# Patient Record
Sex: Female | Born: 1985 | Race: Black or African American | Hispanic: No | Marital: Married | Smoking: Never smoker
Health system: Southern US, Community
[De-identification: ages and names within clinical notes are randomized; demographics above are authoritative.]

## PROBLEM LIST (undated history)

## (undated) DIAGNOSIS — F32A Depression, unspecified: Secondary | ICD-10-CM

---

## 2017-11-27 ENCOUNTER — Other Ambulatory Visit: Payer: Self-pay

## 2017-11-27 ENCOUNTER — Encounter (HOSPITAL_BASED_OUTPATIENT_CLINIC_OR_DEPARTMENT_OTHER): Payer: Self-pay | Admitting: Emergency Medicine

## 2017-11-27 ENCOUNTER — Emergency Department (HOSPITAL_BASED_OUTPATIENT_CLINIC_OR_DEPARTMENT_OTHER)
Admission: EM | Admit: 2017-11-27 | Discharge: 2017-11-27 | Disposition: A | Discharge status: Home / Self Care | Payer: 59 | Attending: Physician Assistant | Admitting: Physician Assistant

## 2017-11-27 DIAGNOSIS — J029 Acute pharyngitis, unspecified: Secondary | ICD-10-CM | POA: Diagnosis present

## 2017-11-27 DIAGNOSIS — J020 Streptococcal pharyngitis: Secondary | ICD-10-CM

## 2017-11-27 LAB — RAPID STREP SCREEN (MED CTR MEBANE ONLY): Streptococcus, Group A Screen (Direct): POSITIVE — AB

## 2017-11-27 MED ORDER — IBUPROFEN 800 MG PO TABS
800.0000 mg | ORAL_TABLET | Freq: Once | ORAL | Status: AC
  Administered 2017-11-27: 800 mg via ORAL
  Filled 2017-11-27: qty 1

## 2017-11-27 MED ORDER — PENICILLIN G BENZATHINE 1200000 UNIT/2ML IM SUSP
1.2000 10*6.[IU] | Freq: Once | INTRAMUSCULAR | Status: AC
  Administered 2017-11-27: 1.2 10*6.[IU] via INTRAMUSCULAR
  Filled 2017-11-27: qty 2

## 2017-11-27 NOTE — ED Triage Notes (Addendum)
Has felt bad since Wed with sore throat, fever, h/a. Yesterday started having diarrhea , no abd pain and woke up with white spots on tonsils . Co-worker was been out this week with Flu

## 2017-11-27 NOTE — Discharge Instructions (Addendum)
Use ibuprofen every 6 hours to help with inflammation and the pain and swelling.  Please throw your toothbrush as we discussed.  Please be sure to stay hydrated using Gatorade or Gatorade plus water.

## 2017-11-27 NOTE — ED Provider Notes (Signed)
[facility] EMERGENCY DEPARTMENT Provider Note   CSN: [id number] Arrival date & time: 11/27/17  [id number]     History   Chief Complaint Chief Complaint  Patient presents with  . Sore Throat    diarrhea    HPI [place] Durwin GlazeGlover is a 32 y.o. female.  HPI   32 year old female presenting with sore throat.  Patient also had a touch of diarrhea.  Patient's kids have been recently sick.  Patient woke up this morning noticed some white spots on the back of her throat.  Came here to be evaluated.  Patient eating and drinking normally.  Does have sore throat.  History reviewed. No pertinent past medical history.  There are no active problems to display for this patient.   History reviewed. No pertinent surgical history.   OB History   None      Home Medications    Prior to Admission medications   Not on File    Family History No family history on file.  Social History Social History   Tobacco Use  . Smoking status: Never Smoker  . Smokeless tobacco: Never Used  Substance Use Topics  . Alcohol use: Yes    Frequency: Never    Comment: social  . Drug use: Never     Allergies   Patient has no known allergies.   Review of Systems Review of Systems  Constitutional: Positive for fatigue.  HENT: Positive for sore throat.   Gastrointestinal: Positive for diarrhea.  All other systems reviewed and are negative.    Physical Exam Updated Vital Signs BP 122/82 (BP Location: Left Arm)   Pulse 76   Temp 98.7 F (37.1 C) (Oral)   Resp 16   Ht 5\' 5"  (1.651 m)   Wt 74.4 kg (164 lb)   SpO2 100%   BMI 27.29 kg/m   Physical Exam  Constitutional: She is oriented to person, place, and time. She appears well-developed and well-nourished.  HENT:  Head: Normocephalic and atraumatic.  Mouth/Throat: No uvula swelling. Oropharyngeal exudate and posterior oropharyngeal erythema present.  Eyes: Pupils are equal, round, and reactive to light. EOM are normal. Right eye  exhibits no discharge.  Cardiovascular: Normal rate, regular rhythm and normal heart sounds.  No murmur heard. Pulmonary/Chest: Effort normal and breath sounds normal. She has no wheezes. She has no rales.  Neurological: She is oriented to person, place, and time.  Skin: Skin is warm and dry. She is not diaphoretic.  Psychiatric: She has a normal mood and affect.  Nursing note and vitals reviewed.    ED Treatments / Results  Labs (all labs ordered are listed, but only abnormal results are displayed) Labs Reviewed  RAPID STREP SCREEN ([facility] ONLY) - Abnormal; Notable for the following components:      Result Value   Streptococcus, Group A Screen (Direct) POSITIVE (*)    All other components within normal limits    EKG None  Radiology No results found.  Procedures Procedures (including critical care time)  Medications Ordered in ED Medications  penicillin g benzathine (BICILLIN LA) 1200000 UNIT/2ML injection 1.2 Million Units (has no administration in time range)  ibuprofen (ADVIL,MOTRIN) tablet 800 mg (has no administration in time range)     Initial Impression / Assessment and Plan / ED Course  I have reviewed the triage vital signs and the nursing notes.  Pertinent labs & imaging results that were available during my care of the patient were reviewed by me and considered in  my medical decision making (see chart for details).    32 year old female presenting with sore throat.  Patient also had a touch of diarrhea.  Patient's kids have been recently sick.  Patient woke up this morning noticed some white spots on the back of her throat.  Came here to be evaluated.  Patient eating and drinking normally.  Does have sore throat.   9:08 AM Strep positive.  Will treat.  Final Clinical Impressions(s) / ED Diagnoses   Final diagnoses:  Strep throat    ED Discharge Orders    None       Abelino Derrick, MD 11/27/17 [phone]

## 2018-02-24 DIAGNOSIS — H00021 Hordeolum internum right upper eyelid: Secondary | ICD-10-CM | POA: Diagnosis not present

## 2018-02-24 DIAGNOSIS — H05011 Cellulitis of right orbit: Secondary | ICD-10-CM | POA: Diagnosis not present

## 2018-03-04 DIAGNOSIS — Z13220 Encounter for screening for lipoid disorders: Secondary | ICD-10-CM | POA: Diagnosis not present

## 2018-03-04 DIAGNOSIS — Z0000 Encounter for general adult medical examination without abnormal findings: Secondary | ICD-10-CM | POA: Diagnosis not present

## 2018-03-04 DIAGNOSIS — Z1339 Encounter for screening examination for other mental health and behavioral disorders: Secondary | ICD-10-CM | POA: Diagnosis not present

## 2018-03-04 DIAGNOSIS — Z131 Encounter for screening for diabetes mellitus: Secondary | ICD-10-CM | POA: Diagnosis not present

## 2018-03-04 DIAGNOSIS — R69 Illness, unspecified: Secondary | ICD-10-CM | POA: Diagnosis not present

## 2018-03-04 DIAGNOSIS — Z114 Encounter for screening for human immunodeficiency virus [HIV]: Secondary | ICD-10-CM | POA: Diagnosis not present

## 2018-03-04 DIAGNOSIS — M25551 Pain in right hip: Secondary | ICD-10-CM | POA: Diagnosis not present

## 2018-07-20 DIAGNOSIS — J069 Acute upper respiratory infection, unspecified: Secondary | ICD-10-CM | POA: Diagnosis not present

## 2018-07-20 DIAGNOSIS — R05 Cough: Secondary | ICD-10-CM | POA: Diagnosis not present

## 2019-07-18 DIAGNOSIS — R05 Cough: Secondary | ICD-10-CM | POA: Diagnosis not present

## 2020-02-17 ENCOUNTER — Other Ambulatory Visit: Payer: Self-pay

## 2020-02-17 ENCOUNTER — Encounter (HOSPITAL_BASED_OUTPATIENT_CLINIC_OR_DEPARTMENT_OTHER): Payer: Self-pay | Admitting: Emergency Medicine

## 2020-02-17 ENCOUNTER — Emergency Department (HOSPITAL_BASED_OUTPATIENT_CLINIC_OR_DEPARTMENT_OTHER): Payer: 59

## 2020-02-17 DIAGNOSIS — U071 COVID-19: Secondary | ICD-10-CM | POA: Diagnosis not present

## 2020-02-17 DIAGNOSIS — R05 Cough: Secondary | ICD-10-CM | POA: Diagnosis not present

## 2020-02-17 DIAGNOSIS — Z5321 Procedure and treatment not carried out due to patient leaving prior to being seen by health care provider: Secondary | ICD-10-CM | POA: Diagnosis not present

## 2020-02-17 DIAGNOSIS — R0602 Shortness of breath: Secondary | ICD-10-CM | POA: Diagnosis not present

## 2020-02-17 DIAGNOSIS — R69 Illness, unspecified: Secondary | ICD-10-CM | POA: Diagnosis present

## 2020-02-17 NOTE — ED Triage Notes (Signed)
Patient states that she has had been sick x 2 weeks. Pt states that she tested positive for COVID 2 days ago. She states that she has a cough but reports that she feels SOB and pressure when she breaths. Pt has a congested cough

## 2020-02-18 ENCOUNTER — Emergency Department (HOSPITAL_BASED_OUTPATIENT_CLINIC_OR_DEPARTMENT_OTHER)
Admission: EM | Admit: 2020-02-18 | Discharge: 2020-02-18 | Disposition: A | Discharge status: Home / Self Care | Payer: 59 | Attending: Emergency Medicine | Admitting: Emergency Medicine

## 2020-02-19 ENCOUNTER — Telehealth (HOSPITAL_COMMUNITY): Payer: Self-pay

## 2020-02-19 DIAGNOSIS — U071 COVID-19: Secondary | ICD-10-CM | POA: Diagnosis not present

## 2020-02-19 DIAGNOSIS — Z9189 Other specified personal risk factors, not elsewhere classified: Secondary | ICD-10-CM | POA: Diagnosis not present

## 2020-12-07 DIAGNOSIS — R69 Illness, unspecified: Secondary | ICD-10-CM | POA: Diagnosis not present

## 2021-08-15 IMAGING — DX DG CHEST 1V PORT
1 series · 1 of 1 positions shown · non-contrast
Comparison: None.

CLINICAL DATA: Cough, shortness of breath, XLLOC-GS positive

EXAM:
PORTABLE CHEST 1 VIEW

[chest ap]
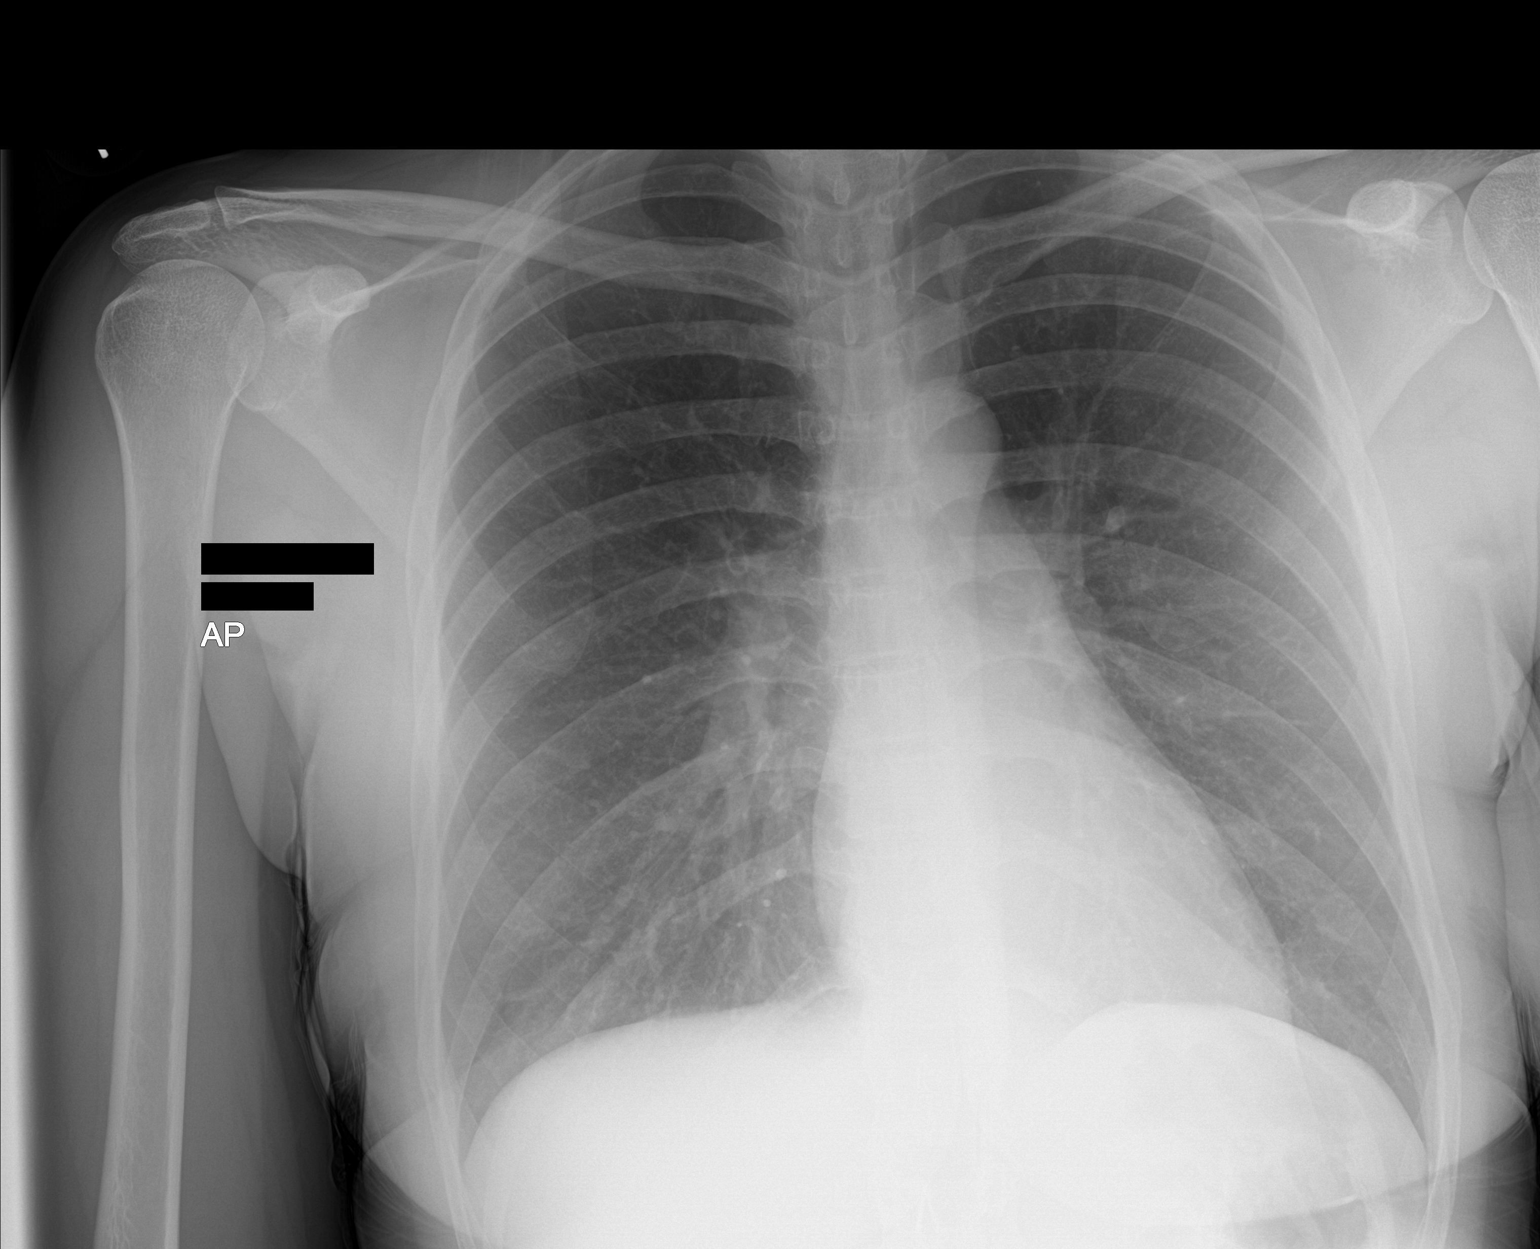

[1 of 1 positions shown; findings below may reference images not displayed]

FINDINGS: No consolidation, features of edema, pneumothorax, or effusion.
Pulmonary vascularity is normally distributed. The cardiomediastinal
contours are unremarkable. No acute osseous or soft tissue
abnormality.
IMPRESSION: No acute cardiopulmonary abnormality.

## 2022-03-11 DIAGNOSIS — R059 Cough, unspecified: Secondary | ICD-10-CM | POA: Diagnosis not present

## 2023-02-10 DIAGNOSIS — J029 Acute pharyngitis, unspecified: Secondary | ICD-10-CM | POA: Diagnosis not present

## 2023-02-10 DIAGNOSIS — R059 Cough, unspecified: Secondary | ICD-10-CM | POA: Diagnosis not present

## 2023-02-10 DIAGNOSIS — R590 Localized enlarged lymph nodes: Secondary | ICD-10-CM | POA: Diagnosis not present

## 2023-12-30 ENCOUNTER — Emergency Department (HOSPITAL_BASED_OUTPATIENT_CLINIC_OR_DEPARTMENT_OTHER)
Admission: EM | Admit: 2023-12-30 | Discharge: 2023-12-31 | Disposition: A | Discharge status: Home / Self Care | Attending: Emergency Medicine | Admitting: Emergency Medicine

## 2023-12-30 ENCOUNTER — Other Ambulatory Visit: Payer: Self-pay

## 2023-12-30 ENCOUNTER — Encounter (HOSPITAL_BASED_OUTPATIENT_CLINIC_OR_DEPARTMENT_OTHER): Payer: Self-pay

## 2023-12-30 DIAGNOSIS — R1084 Generalized abdominal pain: Secondary | ICD-10-CM | POA: Insufficient documentation

## 2023-12-30 DIAGNOSIS — R7989 Other specified abnormal findings of blood chemistry: Secondary | ICD-10-CM | POA: Diagnosis present

## 2023-12-30 DIAGNOSIS — K824 Cholesterolosis of gallbladder: Secondary | ICD-10-CM | POA: Diagnosis not present

## 2023-12-30 DIAGNOSIS — R197 Diarrhea, unspecified: Secondary | ICD-10-CM | POA: Diagnosis not present

## 2023-12-30 DIAGNOSIS — R112 Nausea with vomiting, unspecified: Secondary | ICD-10-CM | POA: Diagnosis present

## 2023-12-30 HISTORY — DX: Depression, unspecified: F32.A

## 2023-12-30 LAB — CBC WITH DIFFERENTIAL/PLATELET
Abs Immature Granulocytes: 0.02 10*3/uL (ref 0.00–0.07)
Basophils Absolute: 0 10*3/uL (ref 0.0–0.1)
Basophils Relative: 0 %
Eosinophils Absolute: 0 10*3/uL (ref 0.0–0.5)
Eosinophils Relative: 0 %
HCT: 43.6 % (ref 36.0–46.0)
Hemoglobin: 15.9 g/dL — ABNORMAL HIGH (ref 12.0–15.0)
Immature Granulocytes: 0 %
Lymphocytes Relative: 10 %
Lymphs Abs: 0.8 10*3/uL (ref 0.7–4.0)
MCH: 33.8 pg (ref 26.0–34.0)
MCHC: 36.5 g/dL — ABNORMAL HIGH (ref 30.0–36.0)
MCV: 92.6 fL (ref 80.0–100.0)
Monocytes Absolute: 0.5 10*3/uL (ref 0.1–1.0)
Monocytes Relative: 7 %
Neutro Abs: 6.4 10*3/uL (ref 1.7–7.7)
Neutrophils Relative %: 83 %
Platelets: 308 10*3/uL (ref 150–400)
RBC: 4.71 MIL/uL (ref 3.87–5.11)
RDW: 14.3 % (ref 11.5–15.5)
WBC: 7.8 10*3/uL (ref 4.0–10.5)
nRBC: 0 % (ref 0.0–0.2)

## 2023-12-30 LAB — URINALYSIS, ROUTINE W REFLEX MICROSCOPIC
Glucose, UA: NEGATIVE mg/dL
Ketones, ur: 40 mg/dL — AB
Leukocytes,Ua: NEGATIVE
Nitrite: NEGATIVE
Protein, ur: 100 mg/dL — AB
Specific Gravity, Urine: >=1.03 (ref 1.005–1.030)
pH: 5.5 (ref 5.0–8.0)

## 2023-12-30 LAB — URINALYSIS, MICROSCOPIC (REFLEX)

## 2023-12-30 LAB — BASIC METABOLIC PANEL WITH GFR
Anion gap: 23 — ABNORMAL HIGH (ref 5–15)
BUN: 9 mg/dL (ref 6–20)
CO2: 20 mmol/L — ABNORMAL LOW (ref 22–32)
Calcium: 9.9 mg/dL (ref 8.9–10.3)
Chloride: 92 mmol/L — ABNORMAL LOW (ref 98–111)
Creatinine, Ser: 0.95 mg/dL (ref 0.44–1.00)
GFR, Estimated: >60 mL/min (ref >60)
Glucose, Bld: 93 mg/dL (ref 70–99)
Potassium: 4 mmol/L (ref 3.5–5.1)
Sodium: 136 mmol/L (ref 135–145)

## 2023-12-30 LAB — PREGNANCY, URINE: Preg Test, Ur: NEGATIVE

## 2023-12-30 MED ORDER — SODIUM CHLORIDE 0.9 % IV BOLUS
1000.0000 mL | Freq: Once | INTRAVENOUS | Status: AC
  Administered 2023-12-30: 1000 mL via INTRAVENOUS

## 2023-12-30 MED ORDER — LACTATED RINGERS IV BOLUS: 1000.0000 mL | Freq: Once | INTRAVENOUS | Status: DC

## 2023-12-30 MED ORDER — ONDANSETRON HCL 4 MG/2ML IJ SOLN
4.0000 mg | Freq: Once | INTRAMUSCULAR | Status: AC
  Administered 2023-12-31: 4 mg via INTRAVENOUS
  Filled 2023-12-30: qty 2

## 2023-12-30 MED ORDER — FAMOTIDINE IN NACL 20-0.9 MG/50ML-% IV SOLN
20.0000 mg | Freq: Once | INTRAVENOUS | Status: AC
  Administered 2023-12-31: 20 mg via INTRAVENOUS
  Filled 2023-12-30: qty 50

## 2023-12-30 MED ORDER — PANTOPRAZOLE SODIUM 40 MG IV SOLR
40.0000 mg | Freq: Once | INTRAVENOUS | Status: AC
  Administered 2023-12-31: 40 mg via INTRAVENOUS
  Filled 2023-12-30: qty 10

## 2023-12-30 NOTE — ED Triage Notes (Signed)
 Started on Wellbutrin, 1st dose today 1/2 pill (150mg ) @ 1100am Had 2 shots of tequila at noon and has been vomiting since then.

## 2023-12-30 NOTE — ED Provider Notes (Signed)
 [place] EMERGENCY DEPARTMENT AT [facility] Provider Note   CSN: [id number] Arrival date & time: 12/30/23  2157     Patient presents with: Emesis   Shari Anderson is a 38 y.o. female.   Patient presents with nausea and morning since approximately noon.  She felt well prior to this.  She started new medication today Wellbutrin for the first time.  She took this around 11 AM followed by 2 shots of alcohol  about an hour later.  She then developed nausea and vomiting.  Started at the yellow emesis with some blood streaks.  States has been vomiting every hour since.  Now having some diarrhea as well.  Diffuse crampy abdominal pain.  No history of acid reflux or ulcers.  No previous abdominal surgeries.  No chest pain or shortness of breath.  No pain with urination or blood in the urine.  No vaginal bleeding or discharge.  She has never had this medication before.  She also uses marijuana on a regular basis and felt like it made her worse this afternoon.  No travel or sick contacts  The history is provided by the patient and the spouse.  Emesis Associated symptoms: abdominal pain and diarrhea   Associated symptoms: no arthralgias, no cough, no fever, no headaches and no myalgias        Prior to Admission medications   Not on File    Allergies: Patient has no known allergies.    Review of Systems  Constitutional:  Positive for activity change and appetite change. Negative for fever.  HENT:  Negative for congestion and rhinorrhea.   Respiratory:  Negative for cough and shortness of breath.   Cardiovascular:  Negative for chest pain.  Gastrointestinal:  Positive for abdominal pain, diarrhea, nausea and vomiting.  Genitourinary:  Negative for dysuria and hematuria.  Musculoskeletal:  Negative for arthralgias and myalgias.  Skin:  Negative for rash.  Neurological:  Negative for dizziness, weakness and headaches.   all other systems are negative except as noted in the HPI and  PMH.    Updated Vital Signs BP (!) 146/96   Pulse (!) 106   Temp 99.8 F (37.7 C) (Tympanic)   Ht 5' 5 (1.651 m)   Wt 74.8 kg   SpO2 99%   BMI 27.46 kg/m   Physical Exam Vitals and nursing note reviewed.  Constitutional:      General: She is not in acute distress.    Appearance: She is well-developed.  HENT:     Head: Normocephalic and atraumatic.     Mouth/Throat:     Pharynx: No oropharyngeal exudate.  Eyes:     Conjunctiva/sclera: Conjunctivae normal.     Pupils: Pupils are equal, round, and reactive to light.  Neck:     Comments: No meningismus. Cardiovascular:     Rate and Rhythm: Normal rate and regular rhythm.     Heart sounds: Normal heart sounds. No murmur heard. Pulmonary:     Effort: Pulmonary effort is normal. No respiratory distress.     Breath sounds: Normal breath sounds.  Abdominal:     Palpations: Abdomen is soft.     Tenderness: There is no abdominal tenderness. There is no guarding or rebound.     Comments: Soft abdomen, no guarding or rebound  Musculoskeletal:        General: No tenderness. Normal range of motion.     Cervical back: Normal range of motion and neck supple.     Comments: No CVA  tenderness  Skin:    General: Skin is warm.  Neurological:     Mental Status: She is alert and oriented to person, place, and time.     Cranial Nerves: No cranial nerve deficit.     Motor: No abnormal muscle tone.     Coordination: Coordination normal.     Comments:  5/5 strength throughout. CN 2-12 intact.Equal grip strength.   Psychiatric:        Behavior: Behavior normal.     (all labs ordered are listed, but only abnormal results are displayed) Labs Reviewed  URINALYSIS, ROUTINE W REFLEX MICROSCOPIC - Abnormal; Notable for the following components:      Result Value   Hgb urine dipstick TRACE (*)    Bilirubin Urine SMALL (*)    Ketones, ur 40 (*)    Protein, ur 100 (*)    All other components within normal limits  CBC WITH  DIFFERENTIAL/PLATELET - Abnormal; Notable for the following components:   Hemoglobin 15.9 (*)    MCHC 36.5 (*)    All other components within normal limits  BASIC METABOLIC PANEL WITH GFR - Abnormal; Notable for the following components:   Chloride 92 (*)    CO2 20 (*)    Anion gap 23 (*)    All other components within normal limits  URINALYSIS, MICROSCOPIC (REFLEX) - Abnormal; Notable for the following components:   Bacteria, UA FEW (*)    All other components within normal limits  HEPATIC FUNCTION PANEL - Abnormal; Notable for the following components:   AST 140 (*)    ALT 111 (*)    Bilirubin, Direct 0.4 (*)    All other components within normal limits  BASIC METABOLIC PANEL WITH GFR - Abnormal; Notable for the following components:   Sodium 134 (*)    Chloride 95 (*)    CO2 21 (*)    Anion gap 18 (*)    All other components within normal limits  PREGNANCY, URINE  LIPASE, BLOOD  LACTIC ACID, PLASMA  LACTIC ACID, PLASMA    EKG: None  Radiology: CT ABDOMEN PELVIS W CONTRAST Result Date: 12/31/2023 CLINICAL DATA:  Bowel obstruction suspected Had 2 shots of tequila at noon and has been vomiting since then. EXAM: CT ABDOMEN AND PELVIS WITH CONTRAST TECHNIQUE: Multidetector CT imaging of the abdomen and pelvis was performed using the standard protocol following bolus administration of intravenous contrast. RADIATION DOSE REDUCTION: This exam was performed according to the departmental dose-optimization program which includes automated exposure control, adjustment of the mA and/or kV according to patient size and/or use of iterative reconstruction technique. CONTRAST:  OMNIPAQUE  IOHEXOL  300 MG/ML  SOLN COMPARISON:  None Available. FINDINGS: Lower chest: No acute abnormality. Hepatobiliary: No focal liver abnormality. No gallstones, gallbladder wall thickening, or pericholecystic fluid. No biliary dilatation. Pancreas: No focal lesion. Normal pancreatic contour. No surrounding  inflammatory changes. No main pancreatic ductal dilatation. Spleen: Normal in size without focal abnormality. Adrenals/Urinary Tract: No adrenal nodule bilaterally. Bilateral kidneys enhance symmetrically. No hydronephrosis. No hydroureter. The urinary bladder is unremarkable. Stomach/Bowel: Stomach is within normal limits. No evidence of bowel wall thickening or dilatation. Appendix appears normal. Vascular/Lymphatic: No abdominal aorta or iliac aneurysm. No abdominal, pelvic, or inguinal lymphadenopathy. Reproductive: T-shaped intrauterine device in appropriate position within the uterus. Uterus and bilateral adnexa are unremarkable. Other: No intraperitoneal free fluid. No intraperitoneal free gas. No organized fluid collection. Musculoskeletal: No abdominal wall hernia or abnormality. No suspicious lytic or blastic osseous lesions. No acute  displaced fracture. IMPRESSION: No acute intra-abdominal or intrapelvic abnormality. Electronically Signed   By: Morgane  Naveau M.D.   On: 12/31/2023 00:39     Procedures   Medications Ordered in the ED  sodium chloride  0.9 % bolus 1,000 mL (has no administration in time range)  ondansetron  (ZOFRAN ) injection 4 mg (has no administration in time range)  famotidine  (PEPCID ) IVPB 20 mg premix (has no administration in time range)  pantoprazole  (PROTONIX ) injection 40 mg (has no administration in time range)                                    Medical Decision Making Amount and/or Complexity of Data Reviewed Labs: ordered. Decision-making details documented in ED Course. Radiology: ordered and independent interpretation performed. Decision-making details documented in ED Course. ECG/medicine tests: ordered and independent interpretation performed. Decision-making details documented in ED Course.  Risk Prescription drug management.   Nausea, vomiting, diarrhea since noon today after starting Wellbutrin as well as using some alcohol .  Stable vital signs.   Mildly tachycardic.  Abdomen soft without peritoneal signs.  Suspect symptoms likely secondary to medication and alcohol  use.  Will give IV fluids and antiemetics.  Low concern for cholecystitis or appendicitis.  HCG is negative.  Labs with Anion gap acidosis and ketones.  Will hydrate.  No fever.  IVF Given.  LFT elevation noted of unclear etiology.  Did have some alcohol  tonight.  Denies any abdominal pain.  Lipase is normal. LFT likely secondary to use.  Lipase is normal.  Anion gap has improved with IV fluids. CT scan is negative for acute pathology.  Patient feels improved and able to tolerate p.o.  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  Will start PPI.  Avoid caffeine, NSAID medications, alcohol , spicy foods.  Continue Wellbutrin as prescribed. Return to the ED with worsening symptoms.     Final diagnoses:  Nausea and vomiting, unspecified vomiting type    ED Discharge Orders     None          Kristl Morioka, Garnette, MD 12/31/23 [phone]

## 2023-12-31 ENCOUNTER — Ambulatory Visit (HOSPITAL_BASED_OUTPATIENT_CLINIC_OR_DEPARTMENT_OTHER)
Admission: RE | Admit: 2023-12-31 | Discharge: 2023-12-31 | Disposition: A | Discharge status: Home / Self Care | Source: Ambulatory Visit | Attending: Emergency Medicine | Admitting: Emergency Medicine

## 2023-12-31 ENCOUNTER — Other Ambulatory Visit (HOSPITAL_BASED_OUTPATIENT_CLINIC_OR_DEPARTMENT_OTHER)

## 2023-12-31 ENCOUNTER — Emergency Department (HOSPITAL_BASED_OUTPATIENT_CLINIC_OR_DEPARTMENT_OTHER)

## 2023-12-31 ENCOUNTER — Encounter (HOSPITAL_BASED_OUTPATIENT_CLINIC_OR_DEPARTMENT_OTHER): Payer: Self-pay

## 2023-12-31 DIAGNOSIS — K824 Cholesterolosis of gallbladder: Secondary | ICD-10-CM | POA: Insufficient documentation

## 2023-12-31 LAB — HEPATIC FUNCTION PANEL
ALT: 111 U/L — ABNORMAL HIGH (ref 0–44)
AST: 140 U/L — ABNORMAL HIGH (ref 15–41)
Albumin: 4.8 g/dL (ref 3.5–5.0)
Alkaline Phosphatase: 75 U/L (ref 38–126)
Bilirubin, Direct: 0.4 mg/dL — ABNORMAL HIGH (ref 0.0–0.2)
Indirect Bilirubin: 0.4 mg/dL (ref 0.3–0.9)
Total Bilirubin: 0.7 mg/dL (ref 0.0–1.2)
Total Protein: 7.9 g/dL (ref 6.5–8.1)

## 2023-12-31 LAB — BASIC METABOLIC PANEL WITH GFR
Anion gap: 18 — ABNORMAL HIGH (ref 5–15)
BUN: 9 mg/dL (ref 6–20)
CO2: 21 mmol/L — ABNORMAL LOW (ref 22–32)
Calcium: 9 mg/dL (ref 8.9–10.3)
Chloride: 95 mmol/L — ABNORMAL LOW (ref 98–111)
Creatinine, Ser: 0.85 mg/dL (ref 0.44–1.00)
GFR, Estimated: >60 mL/min (ref >60)
Glucose, Bld: 78 mg/dL (ref 70–99)
Potassium: 4.3 mmol/L (ref 3.5–5.1)
Sodium: 134 mmol/L — ABNORMAL LOW (ref 135–145)

## 2023-12-31 LAB — LACTIC ACID, PLASMA
Lactic Acid, Venous: 1.7 mmol/L (ref 0.5–1.9)
Lactic Acid, Venous: 1.9 mmol/L (ref 0.5–1.9)

## 2023-12-31 LAB — LIPASE, BLOOD: Lipase: 16 U/L (ref 11–51)

## 2023-12-31 MED ORDER — IOHEXOL 300 MG/ML  SOLN
100.0000 mL | Freq: Once | INTRAMUSCULAR | Status: AC | PRN
  Administered 2023-12-31: 100 mL via INTRAVENOUS

## 2023-12-31 MED ORDER — ONDANSETRON 4 MG PO TBDP: 4.0000 mg | ORAL_TABLET | Freq: Three times a day (TID) | ORAL | 0 refills | Status: AC | PRN

## 2023-12-31 NOTE — ED Provider Notes (Signed)
 Blood pressure 119/74, pulse 78, temperature 99.8 F (37.7 C), temperature source Tympanic, resp. rate 17, height 5' 5 (1.651 m), weight 74.8 kg, SpO2 99%.   In short, Shari Anderson is a 38 y.o. female with a chief complaint of Emesis .  Refer to the original H&P for additional details.  11:18 AM  Patient returning for right upper quadrant ultrasound.  She was seen last night by the nighttime provider.  Minimally elevated LFTs.  Ultrasound shows no acute cholecystitis or cholelithiasis.  She is feeling much better this morning with resolution of abdominal pain and vomiting.  Plan to have her follow-up in the coming week with her PCP for repeat LFTs.    Darra Fonda MATSU, MD 12/31/23 1119

## 2023-12-31 NOTE — Discharge Instructions (Addendum)
 Take the nausea medication as prescribed. Reduced your alcohol  intake. Start with a clear liquid diet and advance slowly as tolerated.  Follow-up later today for an ultrasound of your gallbladder to evaluate your elevated liver function test.  Reduce your alcohol  intake.  Return to the ED with new or worsening symptoms.

## 2023-12-31 NOTE — ED Provider Notes (Addendum)
 Blood pressure 119/74, pulse 78, temperature 99.8 F (37.7 C), temperature source Tympanic, resp. rate 17, height 5' 5 (1.651 m), weight 74.8 kg, SpO2 99%.   In short, Shari Anderson is a 38 y.o. female with a chief complaint of Emesis .  Refer to the original H&P for additional details.  11:17 AM  Patient returns for right upper quadrant ultrasound this morning after being seen last night.  She is feeling much better this morning.  No continued abdominal pain or vomiting.  She had minimally elevated LFTs last night.  Ultrasound shows no cholecystitis.  No cholelithiasis.  Gallbladder polyps noted.  I discussed these with the patient.  Will have her follow-up with her PCP in the coming week for repeat blood work including LFTs for trending.    Darra Fonda MATSU, MD 12/31/23 1118    Darra Fonda MATSU, MD 12/31/23 1118

## 2024-06-28 ENCOUNTER — Other Ambulatory Visit: Payer: Self-pay

## 2024-06-28 ENCOUNTER — Emergency Department (HOSPITAL_BASED_OUTPATIENT_CLINIC_OR_DEPARTMENT_OTHER)

## 2024-06-28 ENCOUNTER — Emergency Department (HOSPITAL_BASED_OUTPATIENT_CLINIC_OR_DEPARTMENT_OTHER)
Admission: EM | Admit: 2024-06-28 | Discharge: 2024-06-28 | Disposition: A | Discharge status: Home / Self Care | Source: Home / Self Care | Attending: Emergency Medicine | Admitting: Emergency Medicine

## 2024-06-28 ENCOUNTER — Encounter (HOSPITAL_BASED_OUTPATIENT_CLINIC_OR_DEPARTMENT_OTHER): Payer: Self-pay

## 2024-06-28 DIAGNOSIS — W230XXA Caught, crushed, jammed, or pinched between moving objects, initial encounter: Secondary | ICD-10-CM | POA: Insufficient documentation

## 2024-06-28 DIAGNOSIS — S68622A Partial traumatic transphalangeal amputation of right middle finger, initial encounter: Secondary | ICD-10-CM | POA: Diagnosis present

## 2024-06-28 DIAGNOSIS — S68119A Complete traumatic metacarpophalangeal amputation of unspecified finger, initial encounter: Secondary | ICD-10-CM

## 2024-06-28 MED ORDER — MORPHINE SULFATE 15 MG PO TABS: 7.5000 mg | ORAL_TABLET | ORAL | 0 refills | Status: AC | PRN

## 2024-06-28 MED ORDER — CEPHALEXIN 500 MG PO CAPS: 500.0000 mg | ORAL_CAPSULE | Freq: Four times a day (QID) | ORAL | 0 refills | Status: AC

## 2024-06-28 NOTE — ED Provider Notes (Signed)
 " [place] [facility]ERGENCY DEPARTMENT AT [facility] Provider Note   CSN: [id number] Arrival date & time: 06/28/24  0038     Patient presents with: Finger Injury   Shari Anderson is a 38 y.o. female.   38 yo F with a chief complaints of a fingertip amputation.  Patient said she slammed a door shut and then realized that she was bleeding.  She denies other injury.  Tetanus is up-to-date.        Prior to Admission medications  Medication Sig Start Date End Date Taking? Authorizing Provider  cephALEXin (KEFLEX) 500 MG capsule Take 1 capsule (500 mg total) by mouth 4 (four) times daily. 06/28/24  Yes Emil Share, DO  morphine (MSIR) 15 MG tablet Take 0.5 tablets (7.5 mg total) by mouth every 4 (four) hours as needed for severe pain (pain score 7-10). 06/28/24  Yes Emil Share, DO  ondansetron  (ZOFRAN -ODT) 4 MG disintegrating tablet Take 1 tablet (4 mg total) by mouth every 8 (eight) hours as needed for nausea or vomiting. 12/31/23   Carita Senior, MD    Allergies: Patient has no known allergies.    Review of Systems  Updated Vital Signs BP 112/81 (BP Location: Left Arm)   Pulse 87   Temp 98.4 F (36.9 C)   Resp 18   Ht 5' 5 (1.651 m)   Wt 72.6 kg   SpO2 100%   BMI 26.63 kg/m   Physical Exam Vitals and nursing note reviewed.  Constitutional:      General: She is not in acute distress.    Appearance: She is well-developed. She is not diaphoretic.  HENT:     Head: Normocephalic and atraumatic.  Eyes:     Pupils: Pupils are equal, round, and reactive to light.  Cardiovascular:     Rate and Rhythm: Normal rate and regular rhythm.     Heart sounds: No murmur heard.    No friction rub. No gallop.  Pulmonary:     Effort: Pulmonary effort is normal.     Breath sounds: No wheezing or rales.  Abdominal:     General: There is no distension.     Palpations: Abdomen is soft.     Tenderness: There is no abdominal tenderness.  Musculoskeletal:        General: No  tenderness.     Cervical back: Normal range of motion and neck supple.     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  There is no obvious exposed bone.  Skin:    General: Skin is warm and dry.  Neurological:     Mental Status: She is alert and oriented to person, place, and time.  Psychiatric:        Behavior: Behavior normal.     (all labs ordered are listed, but only abnormal results are displayed) Labs Reviewed - No data to display  EKG: None  Radiology: No results found.   Procedures   Medications Ordered in the ED - No data to display                                  Medical Decision Making Amount and/or Complexity of Data Reviewed Radiology: ordered.  Risk Prescription drug management.   38 yo F with a chief complaints of a fingertip amputation.  Reportedly was shut in a car door.  Unable to repair at bedside here.  Started on prophylactic antibiotics.  X-ray.  Splint for comfort.  Hand follow-up.  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  1:14 AM:  I have discussed the diagnosis/risks/treatment options with the patient and family.  Evaluation and diagnostic testing in the emergency department does not suggest an emergent condition requiring admission or immediate intervention beyond what has been performed at this time.  They will follow up with Hand. We also discussed returning to the ED immediately if new or worsening sx occur. We discussed the sx which are most concerning (e.g., sudden worsening pain, fever, inability to tolerate by mouth) that necessitate immediate return. Medications administered to the patient during their visit and any new prescriptions provided to the patient are listed below.  Medications given during this visit Medications - No data to display   The patient appears reasonably screen and/or stabilized for discharge and I doubt any other medical condition or other [facility] requiring  further screening, evaluation, or treatment in the ED at this time prior to discharge.       Final diagnoses:  Fingertip amputation, initial encounter    ED Discharge Orders          Ordered    cephALEXin (KEFLEX) 500 MG capsule  4 times daily        06/28/24 0106    morphine (MSIR) 15 MG tablet  Every 4 hours PRN        06/28/24 0108               Kalum Minner, DO 06/28/24 0114  "

## 2024-06-28 NOTE — Progress Notes (Signed)
 Shari Anderson MRN: [id number] DOB: September 30, 1985 (age: 38 y.o.)   Initial Visit Referred by:  No ref. provider found PCP: No Pcp Chief Complaint:  Chief Complaint  Patient presents with   Left Hand - Injury    RT hand tip of middle finger amputation. Patient slammed her finger in a door at the house 12/30 @12am  She was seen at [facility] where they placed her in a finger splint.     History of Present Illness: Shari Anderson is a 38 year old right-hand-dominant female present with her mother.  She states she slammed her right long finger in the left side of the house door early this morning.  She is seen at the emergency department where radiographs were taken revealing loss of the tuft.  Wound was cleaned and dressing placed.  She is here for further evaluation and care.  She reports no previous injury to the finger no other injury at this time.  Allergies: Allergies[1]  Past Medical History: Medical History[2]  Past Surgical History: Surgical History[3]  Medications: Medications Ordered Prior to Encounter[4]  Family History: Family History[5]  Social History:  Social Connections: Not on file    Vitals:  Vitals:   06/28/24 1544  Temp: 97.8 F (36.6 C)    Physical Exam:  Alert and oriented x 3. Well developed, well nourished. Regular gait.  Right upper extremity: Intact to light touch sensation and capillary refill in the fingertips.  There is good soft tissue turgor in the fingertips.  She can flex and extend the IP joint of the thumb and can cross her fingers.   No swelling, erythema, or ecchymosis. No rashes or lesions.  No signs or symptoms of dystrophy.   Compartments are soft.  There is loss of the distal aspect of the long finger through the nailbed.  There is approximately one third of the nailbed remaining.  Bone exposed.  The nail was avulsed.  Special Investigations- Review of Diagnostic Tests:   Radiological Studies: I have personally reviewed radiographs  taken at an outside facility and my interpretation is as follows: AP lateral oblique views of the right long finger from June 28, 2024 show loss of the distal tuft of the distal phalanx.  There is loss of soft tissue envelope.  Assessment:    ICD-10-CM   1. Amputation of right middle finger  [phone]       Plan: I discussed with Zarayah D Kivi and her mother the nature of the injury.  I would recommend revision amputation of the long finger.  We discussed trying to preserve some of the nail or ablating the nail altogether.  She would like to try to preserve the nail if possible.  We discussed that there can be nail deformity that may require further surgical procedure if it is painful or bothersome.  She understands this. Risks, benefits, and alternatives of surgery were discussed including risks of blood loss, infection, damage to nerves/vessels/tendons/ligaments/bone, failure of surgery, need for additional surgery, complications with wound healing, stiffness, continued pain.  She voiced understanding of these risks and elected to proceed.  We will work on having this arranged.  She agrees with the plan of care.  She is placed back into a dressing with protective splint.  Follow up: Return for First post-op visit at 1 week.  No orders of the defined types were placed in this encounter.       [1] No Known Allergies [2] Past Medical History: Diagnosis Date   Abnormal Pap  smear of cervix   [3] Past Surgical History: Procedure Laterality Date   COLPOSCOPY     Procedure: COLPOSCOPY  [4] Current Outpatient Medications on File Prior to Visit  Medication Sig Dispense Refill   cephALEXin (KEFLEX) 500 mg capsule Take 500 mg by mouth 4 (four) times a day.     morphine (MSIR) 15 mg tablet Take 7.5 mg by mouth.     multivitamin (THERAGRAN) tab tablet Take 1 tablet by mouth Once Daily. (Patient not taking: Reported on 06/28/2024)     No current facility-administered medications on  file prior to visit.  [5] Family History Problem Relation Name Age of Onset   Diabetes Father     Hypertension Father     Breast cancer Maternal Aunt         in her late 73s   Stroke Maternal Grandfather     Hypertension Maternal Grandfather     Stroke Paternal Grandmother

## 2024-06-28 NOTE — ED Notes (Signed)
..  The patient is A&OX4, ambulatory at d/c with independent steady gait, NAD. Pt verbalized understanding of d/c instructions, prescriptions and follow up care.

## 2024-06-28 NOTE — ED Triage Notes (Addendum)
 Patient reports slamming her finger in her door at home and it removed the tip of her finger. Bleeding controlled prior to arrival.  Husband has finger in a plastic bag.

## 2024-06-28 NOTE — Discharge Instructions (Addendum)
 Apply an ointment to the area couple times a day.  Please return for redness drainage or fever.  Please follow-up with the hand doctor in clinic.  Call them in the next workday to try and schedule an appointment.  Take 4 over the counter ibuprofen  tablets 3 times a day or 2 over-the-counter naproxen tablets twice a day for pain. Also take tylenol 1000mg (2 extra strength) four times a day.   Then take the pain medicine if you feel like you need it. Narcotics do not help with the pain, they only make you care about it less.  You can become addicted to this, people may break into your house to steal it.  It will constipate you.  If you drive under the influence of this medicine you can get a DUI.

## 2024-06-30 ENCOUNTER — Encounter (HOSPITAL_BASED_OUTPATIENT_CLINIC_OR_DEPARTMENT_OTHER)
Admission: RE | Disposition: A | Discharge status: Home / Self Care | Payer: Self-pay | Source: Home / Self Care | Attending: Orthopedic Surgery

## 2024-06-30 ENCOUNTER — Other Ambulatory Visit: Payer: Self-pay | Admitting: Orthopedic Surgery

## 2024-06-30 ENCOUNTER — Ambulatory Visit (HOSPITAL_BASED_OUTPATIENT_CLINIC_OR_DEPARTMENT_OTHER)
Admission: RE | Admit: 2024-06-30 | Discharge: 2024-06-30 | Disposition: A | Discharge status: Home / Self Care | Attending: Orthopedic Surgery | Admitting: Orthopedic Surgery

## 2024-06-30 ENCOUNTER — Encounter (HOSPITAL_BASED_OUTPATIENT_CLINIC_OR_DEPARTMENT_OTHER): Payer: Self-pay | Admitting: Orthopedic Surgery

## 2024-06-30 ENCOUNTER — Ambulatory Visit (HOSPITAL_BASED_OUTPATIENT_CLINIC_OR_DEPARTMENT_OTHER): Admitting: Anesthesiology

## 2024-06-30 DIAGNOSIS — S68612A Complete traumatic transphalangeal amputation of right middle finger, initial encounter: Secondary | ICD-10-CM | POA: Insufficient documentation

## 2024-06-30 DIAGNOSIS — W230XXA Caught, crushed, jammed, or pinched between moving objects, initial encounter: Secondary | ICD-10-CM | POA: Insufficient documentation

## 2024-06-30 DIAGNOSIS — S68113A Complete traumatic metacarpophalangeal amputation of left middle finger, initial encounter: Secondary | ICD-10-CM | POA: Diagnosis not present

## 2024-06-30 HISTORY — PX: AMPUTATION FINGER: SHX6594

## 2024-06-30 LAB — POCT PREGNANCY, URINE: Preg Test, Ur: NEGATIVE

## 2024-06-30 SURGERY — AMPUTATION, FINGER
Anesthesia: General | Site: Middle Finger | Laterality: Right

## 2024-06-30 MED ORDER — OXYCODONE HCL 5 MG PO TABS: 5.0000 mg | ORAL_TABLET | Freq: Once | ORAL | Status: DC | PRN

## 2024-06-30 MED ORDER — DEXAMETHASONE SOD PHOSPHATE PF 10 MG/ML IJ SOLN
INTRAMUSCULAR | Status: DC | PRN
  Administered 2024-06-30: 5 mg via INTRAVENOUS

## 2024-06-30 MED ORDER — DROPERIDOL 2.5 MG/ML IJ SOLN: 0.6250 mg | Freq: Once | INTRAMUSCULAR | Status: DC | PRN

## 2024-06-30 MED ORDER — CEFAZOLIN SODIUM-DEXTROSE 2-4 GM/100ML-% IV SOLN
INTRAVENOUS | Status: AC
  Filled 2024-06-30: qty 100

## 2024-06-30 MED ORDER — LACTATED RINGERS IV SOLN: INTRAVENOUS | Status: DC | PRN

## 2024-06-30 MED ORDER — SCOPOLAMINE 1 MG/3DAYS TD PT72
1.0000 | MEDICATED_PATCH | TRANSDERMAL | Status: DC
  Administered 2024-06-30: 1 mg via TRANSDERMAL

## 2024-06-30 MED ORDER — HYDROCODONE-ACETAMINOPHEN 5-325 MG PO TABS: 1.0000 | ORAL_TABLET | Freq: Four times a day (QID) | ORAL | 0 refills | Status: AC | PRN

## 2024-06-30 MED ORDER — PROPOFOL 10 MG/ML IV BOLUS
INTRAVENOUS | Status: DC | PRN
  Administered 2024-06-30: 150 mg via INTRAVENOUS

## 2024-06-30 MED ORDER — 0.9 % SODIUM CHLORIDE (POUR BTL) OPTIME
TOPICAL | Status: DC | PRN
  Administered 2024-06-30: 500 mL

## 2024-06-30 MED ORDER — ACETAMINOPHEN 500 MG PO TABS
1000.0000 mg | ORAL_TABLET | Freq: Once | ORAL | Status: AC
  Administered 2024-06-30: 1000 mg via ORAL

## 2024-06-30 MED ORDER — FENTANYL CITRATE (PF) 100 MCG/2ML IJ SOLN
INTRAMUSCULAR | Status: DC | PRN
  Administered 2024-06-30 (×2): 50 ug via INTRAVENOUS

## 2024-06-30 MED ORDER — BUPIVACAINE HCL (PF) 0.25 % IJ SOLN
INTRAMUSCULAR | Status: DC | PRN
  Administered 2024-06-30: 9 mL

## 2024-06-30 MED ORDER — SCOPOLAMINE 1 MG/3DAYS TD PT72
MEDICATED_PATCH | TRANSDERMAL | Status: AC
  Filled 2024-06-30: qty 1

## 2024-06-30 MED ORDER — ONDANSETRON HCL 4 MG/2ML IJ SOLN
INTRAMUSCULAR | Status: AC
  Filled 2024-06-30: qty 2

## 2024-06-30 MED ORDER — CEFAZOLIN SODIUM-DEXTROSE 2-4 GM/100ML-% IV SOLN
2.0000 g | INTRAVENOUS | Status: AC
  Administered 2024-06-30: 2 g via INTRAVENOUS

## 2024-06-30 MED ORDER — FENTANYL CITRATE (PF) 100 MCG/2ML IJ SOLN: 25.0000 ug | INTRAMUSCULAR | Status: DC | PRN

## 2024-06-30 MED ORDER — MIDAZOLAM HCL 2 MG/2ML IJ SOLN
INTRAMUSCULAR | Status: AC
  Filled 2024-06-30: qty 2

## 2024-06-30 MED ORDER — LIDOCAINE 2% (20 MG/ML) 5 ML SYRINGE
INTRAMUSCULAR | Status: DC | PRN
  Administered 2024-06-30: 100 mg via INTRAVENOUS

## 2024-06-30 MED ORDER — ONDANSETRON HCL 4 MG/2ML IJ SOLN
INTRAMUSCULAR | Status: DC | PRN
  Administered 2024-06-30: 4 mg via INTRAVENOUS

## 2024-06-30 MED ORDER — ACETAMINOPHEN 500 MG PO TABS
ORAL_TABLET | ORAL | Status: AC
  Filled 2024-06-30: qty 2

## 2024-06-30 MED ORDER — OXYCODONE HCL 5 MG/5ML PO SOLN: 5.0000 mg | Freq: Once | ORAL | Status: DC | PRN

## 2024-06-30 MED ORDER — FENTANYL CITRATE (PF) 100 MCG/2ML IJ SOLN
INTRAMUSCULAR | Status: AC
  Filled 2024-06-30: qty 2

## 2024-06-30 MED ORDER — MIDAZOLAM HCL (PF) 2 MG/2ML IJ SOLN
INTRAMUSCULAR | Status: DC | PRN
  Administered 2024-06-30: 2 mg via INTRAVENOUS

## 2024-06-30 SURGICAL SUPPLY — 40 items
BANDAGE GAUZE 1X75IN STRL (MISCELLANEOUS) IMPLANT
BLADE MINI RND TIP GREEN BEAV (BLADE) IMPLANT
BLADE SURG 15 STRL LF DISP TIS (BLADE) ×2 IMPLANT
BNDG COHESIVE 1X5 TAN STRL LF (GAUZE/BANDAGES/DRESSINGS) IMPLANT
BNDG COMPR ESMARK 4X3 LF (GAUZE/BANDAGES/DRESSINGS) IMPLANT
BNDG ELASTIC 2INX 5YD STR LF (GAUZE/BANDAGES/DRESSINGS) IMPLANT
BNDG ELASTIC 3INX 5YD STR LF (GAUZE/BANDAGES/DRESSINGS) IMPLANT
CHLORAPREP W/TINT 26 (MISCELLANEOUS) IMPLANT
CORD BIPOLAR FORCEPS 12FT (ELECTRODE) ×1 IMPLANT
COVER BACK TABLE 60X90IN (DRAPES) ×1 IMPLANT
COVER MAYO STAND STRL (DRAPES) ×1 IMPLANT
DRAIN PENROSE 12X.25 LTX STRL (MISCELLANEOUS) IMPLANT
DRAPE EXTREMITY T 121X128X90 (DISPOSABLE) ×1 IMPLANT
DRAPE OEC MINIVIEW 54X84 (DRAPES) IMPLANT
DRAPE SURG 17X23 STRL (DRAPES) ×1 IMPLANT
GAUZE SPONGE 4X4 12PLY STRL (GAUZE/BANDAGES/DRESSINGS) ×1 IMPLANT
GAUZE SPONGE 4X4 12PLY STRL LF (GAUZE/BANDAGES/DRESSINGS) IMPLANT
GAUZE XEROFORM 1X8 LF (GAUZE/BANDAGES/DRESSINGS) ×1 IMPLANT
GLOVE BIO SURGEON STRL SZ7.5 (GLOVE) ×1 IMPLANT
GLOVE BIOGEL PI IND STRL 8 (GLOVE) ×1 IMPLANT
GOWN STRL REUS W/ TWL LRG LVL3 (GOWN DISPOSABLE) ×1 IMPLANT
GOWN STRL REUS W/TWL XL LVL3 (GOWN DISPOSABLE) ×1 IMPLANT
NEEDLE HYPO 25X1 1.5 SAFETY (NEEDLE) IMPLANT
PACK BASIN DAY SURGERY FS (CUSTOM PROCEDURE TRAY) ×1 IMPLANT
PADDING CAST ABS COTTON 4X4 ST (CAST SUPPLIES) ×1 IMPLANT
SOLN 0.9% NACL POUR BTL 1000ML (IV SOLUTION) ×1 IMPLANT
SPIKE FLUID TRANSFER (MISCELLANEOUS) IMPLANT
SPLINT FNGR PLAIN END 5/8X3.25 (CAST SUPPLIES) IMPLANT
STOCKINETTE 4X48 STRL (DRAPES) ×1 IMPLANT
SUT CHROMIC 4 0 RB 1X27 (SUTURE) IMPLANT
SUT ETHILON 3 0 PS 1 (SUTURE) IMPLANT
SUT ETHILON 4 0 PS 2 18 (SUTURE) IMPLANT
SUT MERSILENE 4 0 P 3 (SUTURE) IMPLANT
SUT MON AB 5-0 P3 18 (SUTURE) IMPLANT
SUT VIC AB 4-0 P-3 18XBRD (SUTURE) IMPLANT
SYR BULB EAR ULCER 3OZ GRN STR (SYRINGE) ×1 IMPLANT
SYR CONTROL 10ML LL (SYRINGE) IMPLANT
TOWEL GREEN STERILE FF (TOWEL DISPOSABLE) ×1 IMPLANT
TRAY DSU PREP LF (CUSTOM PROCEDURE TRAY) ×1 IMPLANT
UNDERPAD 30X36 HEAVY ABSORB (UNDERPADS AND DIAPERS) ×1 IMPLANT

## 2024-06-30 NOTE — Transfer of Care (Signed)
 Immediate Anesthesia Transfer of Care Note  Patient: Shari Anderson  Procedure(s) Performed: Procedures (LRB): REVISION AMPUTATION RIGHT LONG FINGER (Right)  Patient Location: PACU  Anesthesia Type: General  Level of Consciousness: awake, oriented, sedated and patient cooperative  Airway & Oxygen Therapy: Patient Spontanous Breathing  Post-op Assessment: Report given to PACU RN and Post -op Vital signs reviewed and stable  Post vital signs: Reviewed and stable  Complications: No apparent anesthesia complications  Last Vitals:  Vitals Value Taken Time  BP 121/82 06/30/24 14:56  Temp    Pulse 80 06/30/24 14:56  Resp 17 06/30/24 14:56  SpO2 100 % 06/30/24 14:56  Vitals shown include unfiled device data.  Last Pain:  Vitals:   06/30/24 1312  TempSrc: Temporal  PainSc: 8          Complications: No notable events documented.

## 2024-06-30 NOTE — Op Note (Signed)
 NAME: Shari Anderson MEDICAL RECORD NO: [id number] DATE OF BIRTH: 01-03-1986 FACILITY: [facility] LOCATION: [facility] PHYSICIAN: Kinneth Fujiwara R. Traves Majchrzak, MD   OPERATIVE REPORT   DATE OF PROCEDURE: 06/30/2024    PREOPERATIVE DIAGNOSIS: Right long finger traumatic amputation   POSTOPERATIVE DIAGNOSIS: Right long finger traumatic amputation   PROCEDURE: Right long finger revision amputation through nail   SURGEON:  Franky Curia, M.D.   ASSISTANT: none   ANESTHESIA:  General   INTRAVENOUS FLUIDS:  Per anesthesia flow sheet.   ESTIMATED BLOOD LOSS:  Minimal.   COMPLICATIONS:  None.   SPECIMENS:  none   TOURNIQUET TIME:    Total Tourniquet Time Documented: Upper Arm (Right) - 25 minutes Total: Upper Arm (Right) - 25 minutes    DISPOSITION:  Stable to PACU.   INDICATIONS: 39 year old female states 3 days ago she sustained amputation of the right long finger from being closed in a door.  Seen at the emergency department where the wound was cleaned and dressed.  Follow-up in the office.  She wishes to proceed with revision amputation.  Risks, benefits and alternatives of surgery were discussed including the risks of blood loss, infection, damage to nerves, vessels, tendons, ligaments, bone for surgery, need for additional surgery, complications with wound healing, continued pain, stiffness.  She voiced understanding of these risks and elected to proceed.  OPERATIVE COURSE:  After being identified preoperatively by myself,  the patient and I agreed on the procedure and site of the procedure.  The surgical site was marked.  Surgical consent had been signed. Preoperative IV antibiotic prophylaxis was given. She was transferred to the operating room and placed on the operating table in supine position with the right upper extremity on an arm board.  General anesthesia was induced by the anesthesiologist.  Right upper extremity was prepped and draped in normal sterile orthopedic fashion.   A surgical pause was performed between the surgeons, anesthesia, and operating room staff and all were in agreement as to the patient, procedure, and site of procedure.  Tourniquet at the proximal aspect of the extremity was inflated to 250 mmHg after exsanguination of the arm with an Esmarch bandage.  The wound was explored.  There was small amount of fibers in the wound.  These were removed.  The wound was copiously irrigated with sterile saline.  It was debrided of clot.  A knife was used to mobilize the soft tissues around the exposed distal phalanx.  The exposed distal phalanx was removed with the rongeurs.  A fragment of distal phalanx bone was also removed with a knife.  The wound was then irrigated with sterile saline.  The soft tissues were mobilized over the end of the bone.  5-0 Monocryl suture was used in an interrupted fashion to reapproximate the volar tissues to the nailbed dorsally.  Wedges of the skin were taken out on the radial and ulnar sides to provide better contour to the fingertip.  A piece of Xeroform was placed in the nail fold and the wound dressed with sterile Xeroform 4 x 4 and wrapped with a Coban dressing lightly.  AlumaFoam splint was placed and wrapped lightly with Coban dressing.  Digital block was performed with quarter percent plain Marcaine to aid in postoperative analgesia.  The tourniquet was deflated at 25 minutes.  Fingertips were pink with brisk capillary refill after deflation of tourniquet.  The operative  drapes were broken down.  The patient was awoken from anesthesia safely.  She was transferred  back to the stretcher and taken to PACU in stable condition.  I will see her back in the office in 1 week for postoperative followup.  I will give her a prescription for Norco 5/325 1 tab PO q6 hours prn pain, dispense #15.   Audiel Scheiber, MD Electronically signed, 06/30/2024

## 2024-06-30 NOTE — Anesthesia Preprocedure Evaluation (Addendum)
"                                    Anesthesia Evaluation  Patient identified by MRN, date of birth, ID band Patient awake    Reviewed: Allergy & Precautions, NPO status , Patient's Chart, lab work & pertinent test results  History of Anesthesia Complications Negative for: history of anesthetic complications  Airway Mallampati: II  TM Distance: >3 FB Neck ROM: Full    Dental no notable dental hx.    Pulmonary neg pulmonary ROS   Pulmonary exam normal        Cardiovascular negative cardio ROS Normal cardiovascular exam     Neuro/Psych    Depression    negative neurological ROS     GI/Hepatic negative GI ROS,,,(+)     substance abuse  marijuana use  Endo/Other  negative endocrine ROS    Renal/GU negative Renal ROS     Musculoskeletal negative musculoskeletal ROS (+)    Abdominal   Peds  Hematology negative hematology ROS (+)   Anesthesia Other Findings RIGHT LONG FINGER AMPUTATION  Reproductive/Obstetrics                              Anesthesia Physical Anesthesia Plan  ASA: 2  Anesthesia Plan: General   Post-op Pain Management: Tylenol PO (pre-op)*   Induction: Intravenous  PONV Risk Score and Plan: 3 and Treatment may vary due to age or medical condition, Ondansetron , Dexamethasone , Midazolam and Scopolamine patch - Pre-op  Airway Management Planned: LMA  Additional Equipment: None  Intra-op Plan:   Post-operative Plan: Extubation in OR  Informed Consent: I have reviewed the patients History and Physical, chart, labs and discussed the procedure including the risks, benefits and alternatives for the proposed anesthesia with the patient or authorized representative who has indicated his/her understanding and acceptance.     Dental advisory given  Plan Discussed with: CRNA  Anesthesia Plan Comments:          Anesthesia Quick Evaluation  "

## 2024-06-30 NOTE — H&P (Signed)
" °  Shari Anderson is an 39 y.o. female.   Chief Complaint: amputation HPI: 40 yo female with traumatic amputation right long finger.  Seen in ED where wound cleaned and dressed.  Followed up in office.  She wishes to proceed with revision amputation right long finger.  Allergies: Allergies[1]  Past Medical History:  Diagnosis Date   Depression     History reviewed. No pertinent surgical history.  Family History: History reviewed. No pertinent family history.  Social History:   reports that she has never smoked. She has never used smokeless tobacco. She reports current alcohol  use. She reports current drug use. Drug: Marijuana.  Medications: Medications Prior to Admission  Medication Sig Dispense Refill   cephALEXin (KEFLEX) 500 MG capsule Take 1 capsule (500 mg total) by mouth 4 (four) times daily. 20 capsule 0   morphine (MSIR) 15 MG tablet Take 0.5 tablets (7.5 mg total) by mouth every 4 (four) hours as needed for severe pain (pain score 7-10). 5 tablet 0   naproxen (EC NAPROSYN) 500 MG EC tablet Take 500 mg by mouth 2 (two) times daily with a meal.     ondansetron  (ZOFRAN -ODT) 4 MG disintegrating tablet Take 1 tablet (4 mg total) by mouth every 8 (eight) hours as needed for nausea or vomiting. 20 tablet 0    Results for orders placed or performed during the hospital encounter of 06/30/24 (from the past 48 hours)  Pregnancy, urine POC     Status: None   Collection Time: 06/30/24  1:04 PM  Result Value Ref Range   Preg Test, Ur NEGATIVE NEGATIVE    Comment:        THE SENSITIVITY OF THIS METHODOLOGY IS >20 mIU/mL.     No results found.    Blood pressure 126/89, pulse 99, temperature 98.4 F (36.9 C), temperature source Temporal, resp. rate 20, height 5' 5 (1.651 m), weight 73.4 kg, SpO2 100%.  General appearance: alert, cooperative, and appears stated age Head: Normocephalic, without obvious abnormality, atraumatic Neck: supple, symmetrical, trachea midline Extremities:  Intact sensation and capillary refill all digits.  +epl/fpl/io.  Amputation right long finger through nail Skin: Skin color, texture, turgor normal. No rashes or lesions Neurologic: Grossly normal Incision/Wound: none  Assessment/Plan Right long finger amputation.  Plan revision amputation in OR.  Non operative and operative treatment options have been discussed with the patient and patient wishes to proceed with operative treatment. Risks, benefits, and alternatives of surgery have been discussed and the patient agrees with the plan of care.   Harvey Matlack 06/30/2024, 1:15 PM     [1] No Known Allergies  "

## 2024-06-30 NOTE — Anesthesia Procedure Notes (Signed)
 Procedure Name: LMA Insertion Date/Time: 06/30/2024 2:10 PM  Performed by: Delayne Olam BIRCH, CRNAPre-anesthesia Checklist: Patient identified, Emergency Drugs available, Suction available and Patient being monitored Patient Re-evaluated:Patient Re-evaluated prior to induction Oxygen Delivery Method: Circle system utilized Preoxygenation: Pre-oxygenation with 100% oxygen Induction Type: IV induction Ventilation: Mask ventilation without difficulty LMA: LMA inserted LMA Size: 4.0 Number of attempts: 1 Airway Equipment and Method: Bite block Placement Confirmation: positive ETCO2 Tube secured with: Tape Dental Injury: Teeth and Oropharynx as per pre-operative assessment

## 2024-06-30 NOTE — Anesthesia Postprocedure Evaluation (Signed)
"   Anesthesia Post Note  Patient: Dance Movement Psychotherapist  Procedure(s) Performed: REVISION AMPUTATION RIGHT LONG FINGER (Right: Middle Finger)     Patient location during evaluation: PACU Anesthesia Type: General Level of consciousness: awake and alert Pain management: pain level controlled Vital Signs Assessment: post-procedure vital signs reviewed and stable Respiratory status: spontaneous breathing, nonlabored ventilation and respiratory function stable Cardiovascular status: blood pressure returned to baseline Postop Assessment: no apparent nausea or vomiting Anesthetic complications: no   No notable events documented.  Last Vitals:  Vitals:   06/30/24 1456 06/30/24 1500  BP: 121/82 116/78  Pulse: 80 79  Resp: 18 20  Temp: (!) 36.3 C   SpO2: 100% 99%    Last Pain:  Vitals:   06/30/24 1456  TempSrc:   PainSc: 0-No pain                 Vertell Row      "

## 2024-06-30 NOTE — Discharge Instructions (Addendum)
 [facility] Instructions Hand Surgery  Wound Care: Keep your hand elevated above the level of your heart.  Do not allow it to dangle by your side.  Keep the dressing dry and do not remove it unless your doctor advises you to do so.  He will usually change it at the time of your post-op visit.  Moving your fingers is advised to stimulate circulation but will depend on the site of your surgery.  If you have a splint applied, your doctor will advise you regarding movement.  Activity: Do not drive or operate machinery today.  Rest today and then you may return to your normal activity and work as indicated by your physician.  Diet:  Drink liquids today or eat a light diet.  You may resume a regular diet tomorrow.    General expectations: Pain for two to three days. Fingers may become slightly swollen.  Call your doctor if any of the following occur: Severe pain not relieved by pain medication. Elevated temperature. Dressing soaked with blood. Inability to move fingers. White or bluish color to fingers.    Post Anesthesia Home Care Instructions  Activity: Get plenty of rest for the remainder of the day. A responsible individual must stay with you for 24 hours following the procedure.  For the next 24 hours, DO NOT: -Drive a car -Advertising copywriter -Drink alcoholic beverages -Take any medication unless instructed by your physician -Make any legal decisions or sign important papers.  Meals: Start with liquid foods such as gelatin or soup. Progress to regular foods as tolerated. Avoid greasy, spicy, heavy foods. If nausea and/or vomiting occur, drink only clear liquids until the nausea and/or vomiting subsides. Call your physician if vomiting continues.  Special Instructions/Symptoms: Your throat may feel dry or sore from the anesthesia or the breathing tube placed in your throat during surgery. If this causes discomfort, gargle with warm salt water. The discomfort should disappear  within 24 hours.  If you had a scopolamine patch placed behind your ear for the management of post- operative nausea and/or vomiting:  1. The medication in the patch is effective for 72 hours, after which it should be removed.  Wrap patch in a tissue and discard in the trash. Wash hands thoroughly with soap and water. 2. You may remove the patch earlier than 72 hours if you experience unpleasant side effects which may include dry mouth, dizziness or visual disturbances. 3. Avoid touching the patch. Wash your hands with soap and water after contact with the patch.    Next dose of tylenol will be at 7:15pm tonight

## 2024-07-01 ENCOUNTER — Emergency Department (HOSPITAL_BASED_OUTPATIENT_CLINIC_OR_DEPARTMENT_OTHER)
Admission: EM | Admit: 2024-07-01 | Discharge: 2024-07-01 | Disposition: A | Discharge status: Home / Self Care | Payer: Self-pay | Attending: Emergency Medicine | Admitting: Emergency Medicine

## 2024-07-01 ENCOUNTER — Encounter (HOSPITAL_BASED_OUTPATIENT_CLINIC_OR_DEPARTMENT_OTHER): Payer: Self-pay

## 2024-07-01 ENCOUNTER — Other Ambulatory Visit: Payer: Self-pay

## 2024-07-01 DIAGNOSIS — M79644 Pain in right finger(s): Secondary | ICD-10-CM | POA: Diagnosis not present

## 2024-07-01 DIAGNOSIS — Z5189 Encounter for other specified aftercare: Secondary | ICD-10-CM

## 2024-07-01 DIAGNOSIS — Z4801 Encounter for change or removal of surgical wound dressing: Secondary | ICD-10-CM | POA: Insufficient documentation

## 2024-07-01 NOTE — ED Triage Notes (Signed)
 Pt arrives with requests to have wound checked on on her right middle finger. Pt had surgery today by Dr. Murrell from previosu finger injury. Pt reports she woke up and the dressing was off her finger and the wound was bleeding a small amount.

## 2024-07-01 NOTE — ED Provider Notes (Signed)
 " [place] [facility] Provider Note   CSN: [id number] Arrival date & time: 07/01/24  0158     History Chief Complaint  Patient presents with   Wound Check    HPI Shari Anderson is a 39 y.o. female presenting for chief complaint of right hand middle finger wound check. States that she had surgery today to have a amputation revised. States she woke up with the dressing from the postop site off in her bed. .   Patient's recorded medical, surgical, social, medication list and allergies were reviewed in the Snapshot window as part of the initial history.   Review of Systems   Review of Systems  Constitutional:  Negative for chills and fever.  HENT:  Negative for ear pain and sore throat.   Eyes:  Negative for pain and visual disturbance.  Respiratory:  Negative for cough and shortness of breath.   Cardiovascular:  Negative for chest pain and palpitations.  Gastrointestinal:  Negative for abdominal pain and vomiting.  Genitourinary:  Negative for dysuria and hematuria.  Musculoskeletal:  Negative for arthralgias and back pain.  Skin:  Negative for color change and rash.  Neurological:  Negative for seizures and syncope.  All other systems reviewed and are negative.   Physical Exam Updated Vital Signs BP (!) 133/94   Pulse 70   Temp 98.2 F (36.8 C)   Resp 20   Wt 73 kg   SpO2 100%   BMI 26.79 kg/m  Physical Exam Vitals and nursing note reviewed.  Constitutional:      General: She is not in acute distress.    Appearance: She is well-developed.  HENT:     Head: Normocephalic and atraumatic.  Eyes:     Conjunctiva/sclera: Conjunctivae normal.  Cardiovascular:     Rate and Rhythm: Normal rate and regular rhythm.     Heart sounds: No murmur heard. Pulmonary:     Effort: Pulmonary effort is normal. No respiratory distress.     Breath sounds: Normal breath sounds.  Abdominal:     General: There is no distension.     Palpations:  Abdomen is soft.     Tenderness: There is no abdominal tenderness. There is no right CVA tenderness or left CVA tenderness.  Musculoskeletal:        General: Deformity (Postop appearance of the right hand middle phalanx.  Xeroform still sutured under the nailbed.) present. No swelling or tenderness. Normal range of motion.     Cervical back: Neck supple.  Skin:    General: Skin is warm and dry.  Neurological:     General: No focal deficit present.     Mental Status: She is alert and oriented to person, place, and time. Mental status is at baseline.     Cranial Nerves: No cranial nerve deficit.      ED Course/ Medical Decision Making/ A&P    Procedures .Splint Application  Date/Time: 07/01/2024 2:32 AM  Performed by: Jerral Meth, MD Authorized by: Jerral Meth, MD   Consent:    Consent obtained:  Verbal   Consent given by:  Patient   Risks, benefits, and alternatives were discussed: yes     Risks discussed:  Discoloration, numbness, swelling and pain Universal protocol:    Patient identity confirmed:  Verbally with patient Pre-procedure details:    Distal neurologic exam:  Normal Procedure details:    Location:  Finger   Finger location:  R long finger   Supplies:  Aluminum  splint   Attestation: Splint applied and adjusted personally by me   Post-procedure details:    Distal neurologic exam:  Normal   Procedure completion:  Tolerated well, no immediate complications    Medications Ordered in ED Medications - No data to display  Medical Decision Making: 39 year old female presenting for wound check.  Had surgery today.  Postop dressing came off.  Luckily the Xeroform was sutured under the nailbed under the postop dressing. Wound site is clean dry intact.  External dressing from the chart was Xeroform/splint/Coban.  That was replaced here with a frog splint to hold better traction on the finger and patient was educated on management. No acute distress at time of  discharge stable for outpatient care management. Clinical Impression:  1. Visit for wound check      Discharge   Final Clinical Impression(s) / ED Diagnoses Final diagnoses:  Visit for wound check    Rx / DC Orders ED Discharge Orders     None         Jerral Meth, MD 07/01/24 [phone]  "

## 2024-07-03 ENCOUNTER — Encounter (HOSPITAL_BASED_OUTPATIENT_CLINIC_OR_DEPARTMENT_OTHER): Payer: Self-pay | Admitting: Orthopedic Surgery
